# Patient Record
Sex: Male | Born: 1955 | Race: White | Hispanic: No | Smoking: Current every day smoker
Health system: Southern US, Community
[De-identification: ages and names within clinical notes are randomized; demographics above are authoritative.]

---

## 2002-10-24 ENCOUNTER — Encounter: Payer: Self-pay | Admitting: Emergency Medicine

## 2002-10-24 ENCOUNTER — Emergency Department (HOSPITAL_COMMUNITY): Admission: EM | Admit: 2002-10-24 | Discharge: 2002-10-24 | Payer: Self-pay | Admitting: Emergency Medicine

## 2007-02-13 ENCOUNTER — Ambulatory Visit (HOSPITAL_COMMUNITY): Admission: RE | Admit: 2007-02-13 | Discharge: 2007-02-13 | Payer: Self-pay | Admitting: Internal Medicine

## 2009-07-14 IMAGING — CR DG CHEST 2V
2 series · 2 of 2 positions shown · non-contrast
Comparison: There are no prior studies for comparison purposes.

CLINICAL DATA: Smoker, lymphadenopathy.  
CHEST - 2 VIEW:

[view not recorded (1 of 2)]
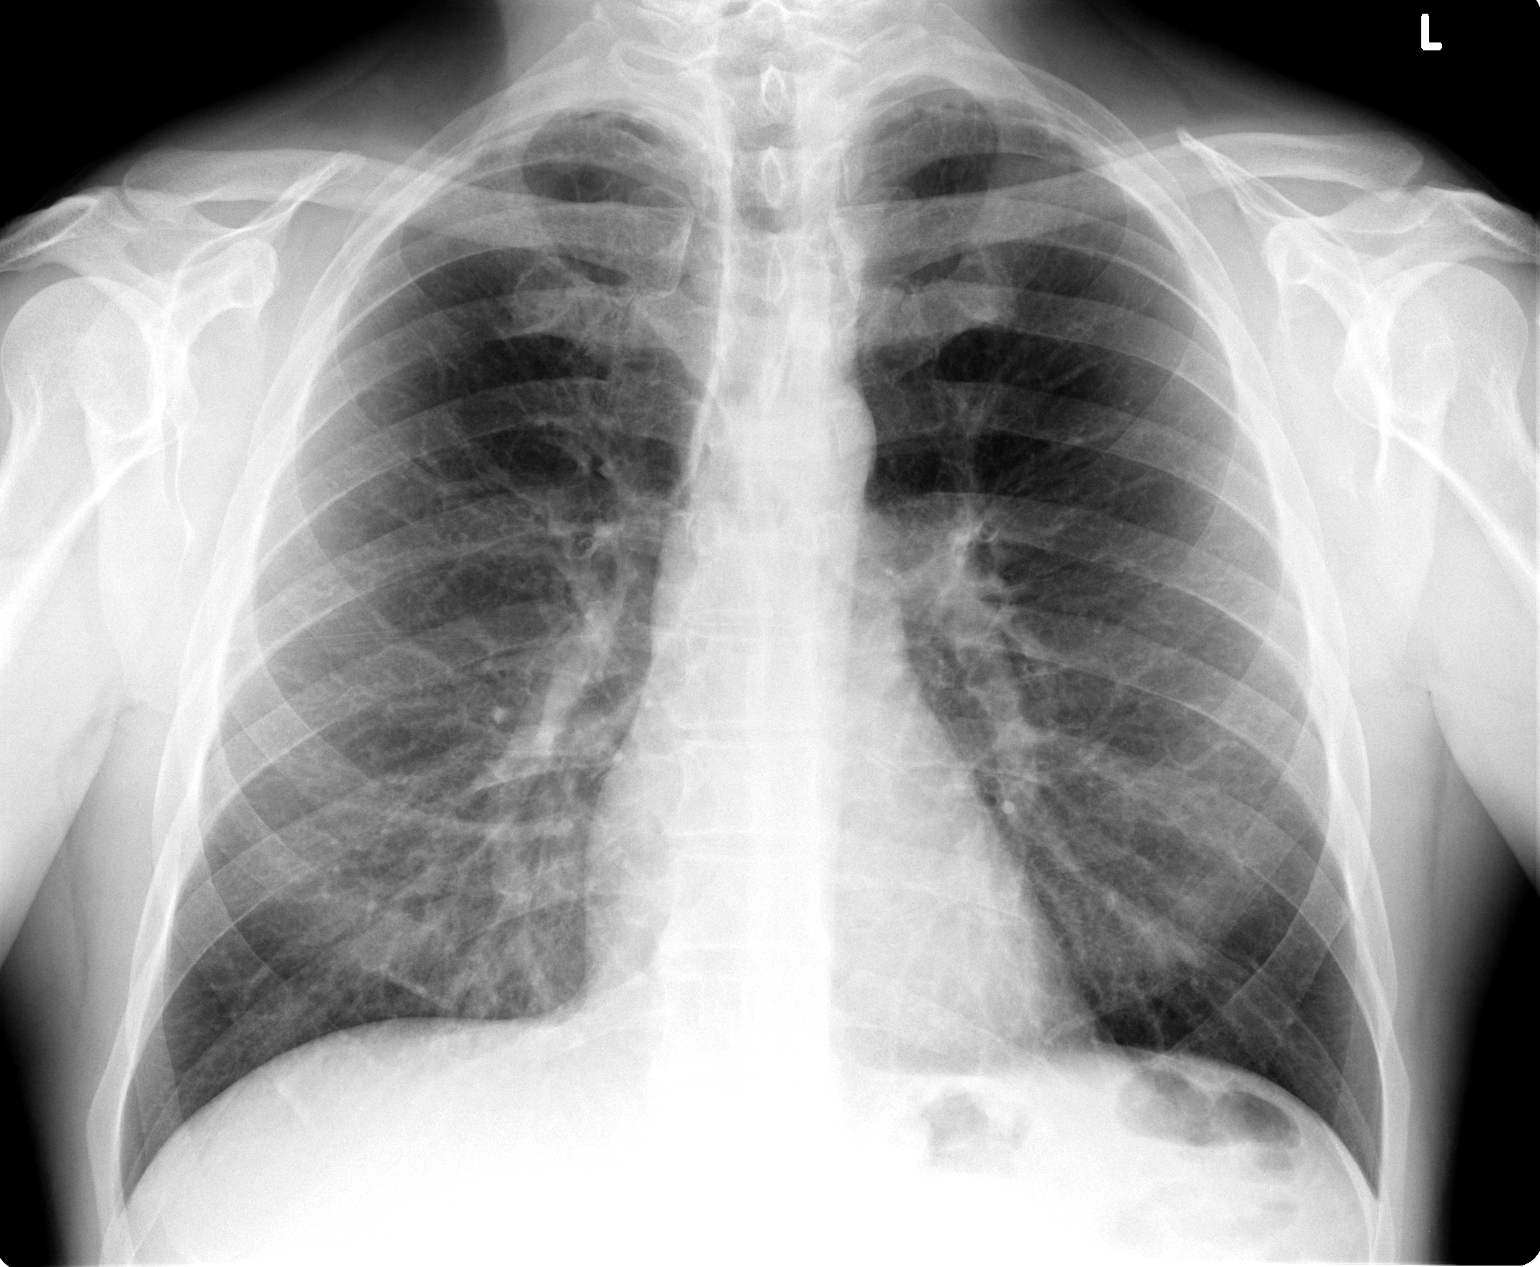

[view not recorded (2 of 2)]
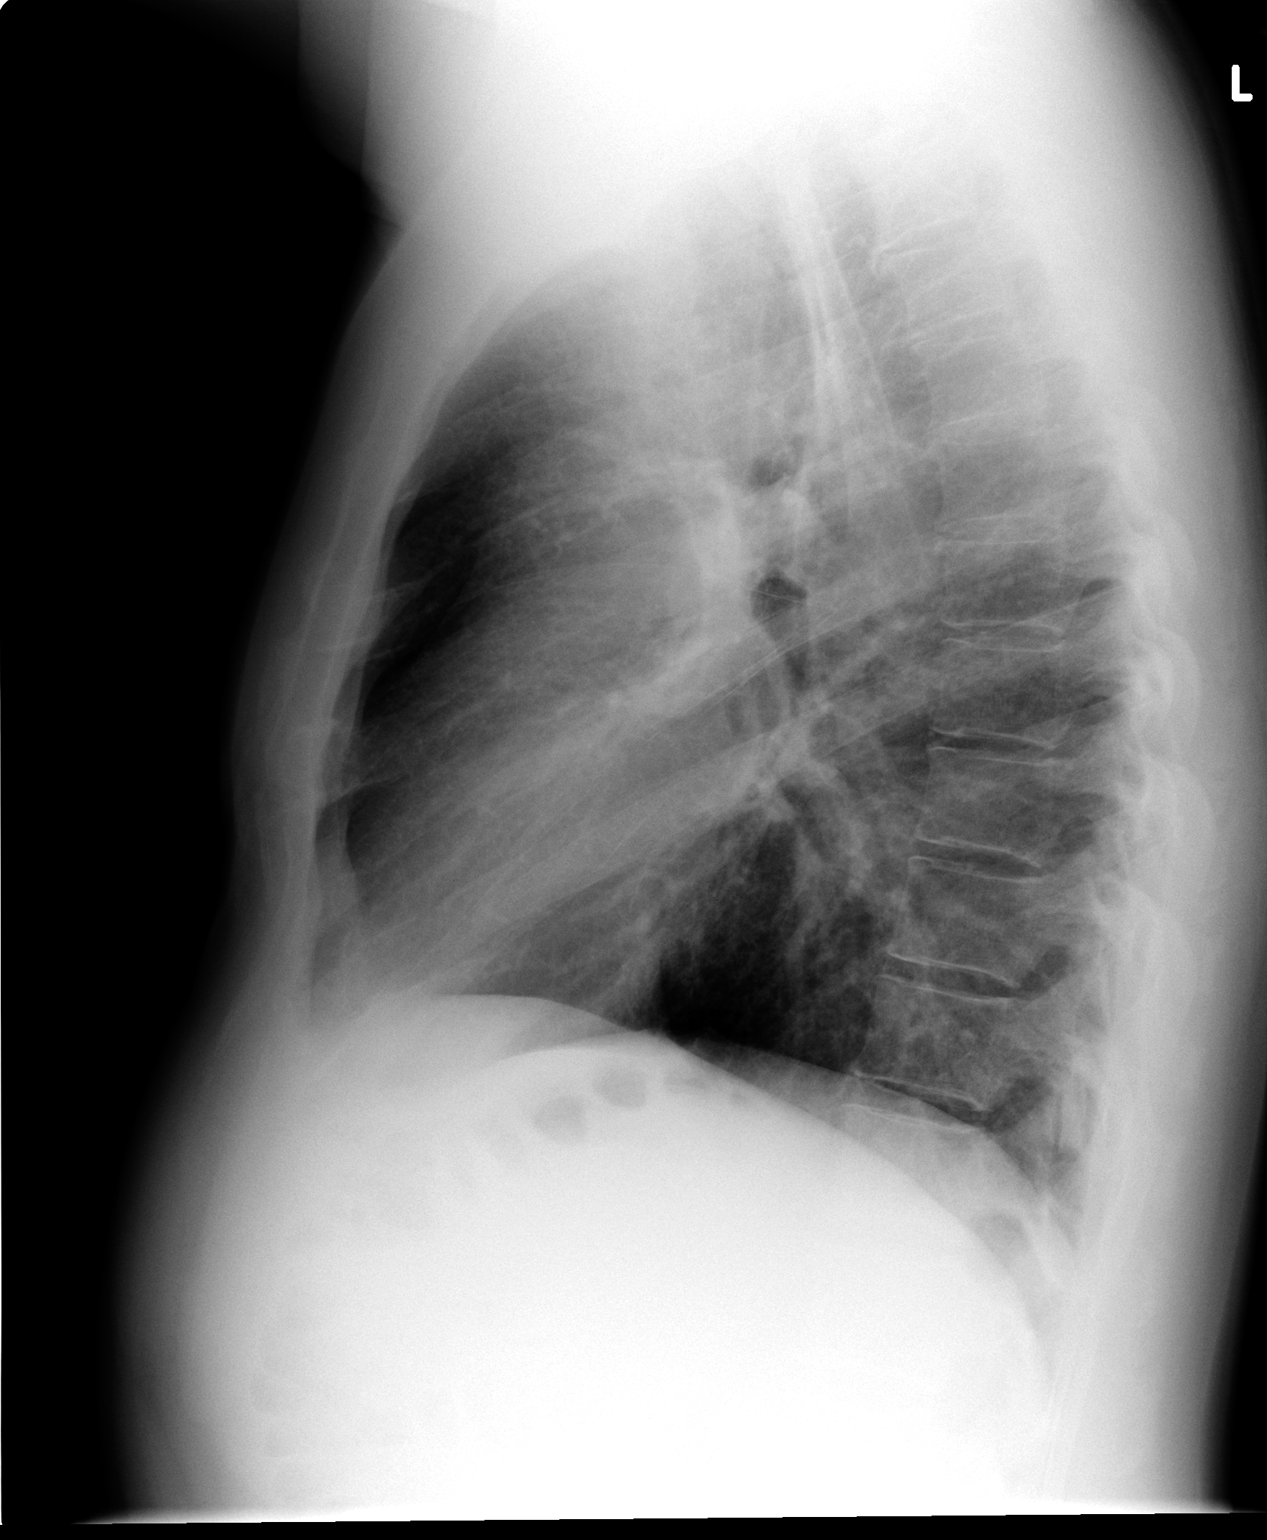

[2 of 2 positions shown; findings below may reference images not displayed]

FINDINGS: The cardiac silhouette, mediastinum, and pulmonary vasculature are within normal limits. Both lungs are clear.   The osseous structures are unremarkable.
IMPRESSION: Normal chest x-ray.

## 2016-01-18 ENCOUNTER — Ambulatory Visit (INDEPENDENT_AMBULATORY_CARE_PROVIDER_SITE_OTHER): Payer: Self-pay | Admitting: Internal Medicine

## 2016-01-18 ENCOUNTER — Encounter: Payer: Self-pay | Admitting: Internal Medicine

## 2016-01-18 VITALS — BP 126/76 | HR 73 | Temp 97.3°F | Resp 16 | Ht 72.0 in | Wt 198.2 lb

## 2016-01-18 DIAGNOSIS — J0140 Acute pansinusitis, unspecified: Secondary | ICD-10-CM

## 2016-01-18 MED ORDER — CLINDAMYCIN HCL 300 MG PO CAPS: 300.0000 mg | ORAL_CAPSULE | Freq: Three times a day (TID) | ORAL | 0 refills | Status: AC

## 2016-01-18 MED ORDER — PROMETHAZINE-DM 6.25-15 MG/5ML PO SYRP: 5.0000 mL | ORAL_SOLUTION | Freq: Four times a day (QID) | ORAL | 0 refills | Status: AC | PRN

## 2016-01-18 MED ORDER — PREDNISONE 20 MG PO TABS: ORAL_TABLET | ORAL | 0 refills | Status: AC

## 2016-01-18 MED ORDER — FLUTICASONE PROPIONATE 50 MCG/ACT NA SUSP: 2.0000 | Freq: Every day | NASAL | 0 refills | Status: AC

## 2016-01-18 NOTE — Progress Notes (Signed)
HPI  Patient presents to the office for evaluation of sinus congestion and sinus pressure.  Patient reports that approximately 3 weeks ago he had the roots of a broken tooth was removed and patient had an abscess drained.  He reports that he is due to have another procedure done.  He reports that he has been having some tooth sensitivity on the other upper teeth.  He does have some throbbing in the maxillary sinus area.  He has been taking aleve.  He reports that he is supposed to have the bone graft done tomorrow.  He reports that he cannot remember the antibiotic, but he said we can call Dr. Mayford Knifeurner.  He reports that he was taking 3 pills a day. He reports that he had the dentist look at it last week.  He is not having drainage and is not having fevers, chills, nausea, or vomiting.  He reports that he is not having any postnasal drainage, no ear drainage, no coughing.    ROS  PE:  General:  Alert and non-toxic, WDWN, NAD HEENT: NCAT, PERLA, EOM normal, no occular discharge or erythema.  Nasal mucosal edema with sinus tenderness to palpation.  Oropharynx clear with minimal oropharyngeal edema and erythema.  Mucous membranes moist and pink. Neck:  Cervical adenopathy Chest:  RRR no MRGs.  Lungs clear to auscultation A&P with no wheezes rhonchi or rales.   Abdomen: +BS x 4 quadrants, soft, non-tender, no guarding, rigidity, or rebound. Skin: warm and dry no rash Neuro: A&Ox4, CN II-XII grossly intact  Assessment and Plan:   1. Acute pansinusitis, recurrence not specified -sudafed pe -benadryl at bedtime - fluticasone (FLONASE) 50 MCG/ACT nasal spray; Place 2 sprays into both nostrils daily.  Dispense: 16 g; Refill: 0 - predniSONE (DELTASONE) 20 MG tablet; 3 tabs po daily x 3 days, then 2 tabs x 3 days, then 1.5 tabs x 3 days, then 1 tab x 3 days, then 0.5 tabs x 3 days  Dispense: 27 tablet; Refill: 0 - clindamycin (CLEOCIN) 300 MG capsule; Take 1 capsule (300 mg total) by mouth 3 (three) times  daily.  Dispense: 21 capsule; Refill: 0 - promethazine-dextromethorphan (PROMETHAZINE-DM) 6.25-15 MG/5ML syrup; Take 5 mLs by mouth 4 (four) times daily as needed for cough.  Dispense: 118 mL; Refill: 0

## 2016-01-18 NOTE — Patient Instructions (Signed)
Please take the sudafed PE up to 3-4 times per day as needed.

## 2017-05-11 ENCOUNTER — Ambulatory Visit (INDEPENDENT_AMBULATORY_CARE_PROVIDER_SITE_OTHER): Payer: Self-pay | Admitting: Adult Health

## 2017-05-11 ENCOUNTER — Encounter: Payer: Self-pay | Admitting: Adult Health

## 2017-05-11 VITALS — BP 140/96 | HR 71 | Temp 97.5°F | Ht 71.0 in | Wt 192.0 lb

## 2017-05-11 DIAGNOSIS — J329 Chronic sinusitis, unspecified: Secondary | ICD-10-CM

## 2017-05-11 DIAGNOSIS — J0101 Acute recurrent maxillary sinusitis: Secondary | ICD-10-CM

## 2017-05-11 MED ORDER — AZITHROMYCIN 250 MG PO TABS: ORAL_TABLET | ORAL | 1 refills | Status: AC

## 2017-05-11 NOTE — Patient Instructions (Signed)
Mucinex/guainefesin   Saline nasal flushes  Consider adding allegra  FLONASE -     Sinusitis, Adult Sinusitis is soreness and inflammation of your sinuses. Sinuses are hollow spaces in the bones around your face. Your sinuses are located:  Around your eyes.  In the middle of your forehead.  Behind your nose.  In your cheekbones.  Your sinuses and nasal passages are lined with a stringy fluid (mucus). Mucus normally drains out of your sinuses. When your nasal tissues become inflamed or swollen, the mucus can become trapped or blocked so air cannot flow through your sinuses. This allows bacteria, viruses, and funguses to grow, which leads to infection. Sinusitis can develop quickly and last for 7?10 days (acute) or for more than 12 weeks (chronic). Sinusitis often develops after a cold. What are the causes? This condition is caused by anything that creates swelling in the sinuses or stops mucus from draining, including:  Allergies.  Asthma.  Bacterial or viral infection.  Abnormally shaped bones between the nasal passages.  Nasal growths that contain mucus (nasal polyps).  Narrow sinus openings.  Pollutants, such as chemicals or irritants in the air.  A foreign object stuck in the nose.  A fungal infection. This is rare.  What increases the risk? The following factors may make you more likely to develop this condition:  Having allergies or asthma.  Having had a recent cold or respiratory tract infection.  Having structural deformities or blockages in your nose or sinuses.  Having a weak immune system.  Doing a lot of swimming or diving.  Overusing nasal sprays.  Smoking.  What are the signs or symptoms? The main symptoms of this condition are pain and a feeling of pressure around the affected sinuses. Other symptoms include:  Upper toothache.  Earache.  Headache.  Bad breath.  Decreased sense of smell and taste.  A cough that may get worse at  night.  Fatigue.  Fever.  Thick drainage from your nose. The drainage is often green and it may contain pus (purulent).  Stuffy nose or congestion.  Postnasal drip. This is when extra mucus collects in the throat or back of the nose.  Swelling and warmth over the affected sinuses.  Sore throat.  Sensitivity to light.  How is this diagnosed? This condition is diagnosed based on symptoms, a medical history, and a physical exam. To find out if your condition is acute or chronic, your health care provider may:  Look in your nose for signs of nasal polyps.  Tap over the affected sinus to check for signs of infection.  View the inside of your sinuses using an imaging device that has a light attached (endoscope).  If your health care provider suspects that you have chronic sinusitis, you may also:  Be tested for allergies.  Have a sample of mucus taken from your nose (nasal culture) and checked for bacteria.  Have a mucus sample examined to see if your sinusitis is related to an allergy.  If your sinusitis does not respond to treatment and it lasts longer than 8 weeks, you may have an MRI or CT scan to check your sinuses. These scans also help to determine how severe your infection is. In rare cases, a bone biopsy may be done to rule out more serious types of fungal sinus disease. How is this treated? Treatment for sinusitis depends on the cause and whether your condition is chronic or acute. If a virus is causing your sinusitis, your symptoms will  go away on their own within 10 days. You may be given medicines to relieve your symptoms, including:  Topical nasal decongestants. They shrink swollen nasal passages and let mucus drain from your sinuses.  Antihistamines. These drugs block inflammation that is triggered by allergies. This can help to ease swelling in your nose and sinuses.  Topical nasal corticosteroids. These are nasal sprays that ease inflammation and swelling in  your nose and sinuses.  Nasal saline washes. These rinses can help to get rid of thick mucus in your nose.  If your condition is caused by bacteria, you will be given an antibiotic medicine. If your condition is caused by a fungus, you will be given an antifungal medicine. Surgery may be needed to correct underlying conditions, such as narrow nasal passages. Surgery may also be needed to remove polyps. Follow these instructions at home: Medicines  Take, use, or apply over-the-counter and prescription medicines only as told by your health care provider. These may include nasal sprays.  If you were prescribed an antibiotic medicine, take it as told by your health care provider. Do not stop taking the antibiotic even if you start to feel better. Hydrate and Humidify  Drink enough water to keep your urine clear or pale yellow. Staying hydrated will help to thin your mucus.  Use a cool mist humidifier to keep the humidity level in your home above 50%.  Inhale steam for 10-15 minutes, 3-4 times a day or as told by your health care provider. You can do this in the bathroom while a hot shower is running.  Limit your exposure to cool or dry air. Rest  Rest as much as possible.  Sleep with your head raised (elevated).  Make sure to get enough sleep each night. General instructions  Apply a warm, moist washcloth to your face 3-4 times a day or as told by your health care provider. This will help with discomfort.  Wash your hands often with soap and water to reduce your exposure to viruses and other germs. If soap and water are not available, use hand sanitizer.  Do not smoke. Avoid being around people who are smoking (secondhand smoke).  Keep all follow-up visits as told by your health care provider. This is important. Contact a health care provider if:  You have a fever.  Your symptoms get worse.  Your symptoms do not improve within 10 days. Get help right away if:  You have a  severe headache.  You have persistent vomiting.  You have pain or swelling around your face or eyes.  You have vision problems.  You develop confusion.  Your neck is stiff.  You have trouble breathing. This information is not intended to replace advice given to you by your health care provider. Make sure you discuss any questions you have with your health care provider. Document Released: 03/07/2005 Document Revised: 11/01/2015 Document Reviewed: 12/31/2014 Elsevier Interactive Patient Education  [employer].

## 2017-05-11 NOTE — Progress Notes (Signed)
Assessment and Plan:  Calvin Molina was seen today for sinus problem.  Diagnoses and all orders for this visit:  Acute recurrent maxillary sinusitis Suggested symptomatic OTC remedies. Nasal saline spray for congestion. Nasal steroids, allergy pill, cannot tolerate oral steroids Follow up as needed. Present to ER for worsening symptoms, vision changes, HA -     azithromycin (ZITHROMAX) 250 MG tablet; Take 2 tablets (500 mg) on  Day 1,  followed by 1 tablet (250 mg) once daily on Days 2 through 5.  Further disposition pending results of labs. Discussed med's effects and SE's.   Over 15 minutes of exam, counseling, chart review, and critical decision making was performed.   No future appointments.  ------------------------------------------------------------------------------------------------------------------   HPI BP (!) 140/96   Pulse 71   Temp (!) 97.5 F (36.4 C)   Ht 5\' 11"  (1.803 m)   Wt 192 lb (87.1 kg)   SpO2 97%   BMI 26.78 kg/m   62 y.o.male presents for 5 days of sinus pain - R maxillary pressure/pain radiating to jaw/teeth. Denies fever/chills, vision changes, dizziness, cough, sore throat, nausea/vomiting.   He has tried sudafed and benadryl with some benefit, has been icing at night.   He reports recurrent annual sinus infections. Doesn't tolerate prednisone. Hasn't tried flonase. Reports typically recovers well with zpak. He is a current everyday smoker.   No past medical history on file.   Allergies  Allergen Reactions  . Bee Venom   . Penicillins   . Prednisone Other (See Comments)    Chest pain    Current Outpatient Medications on File Prior to Visit  Medication Sig  . fluticasone (FLONASE) 50 MCG/ACT nasal spray Place 2 sprays into both nostrils daily. (Patient not taking: Reported on 05/11/2017)  . predniSONE (DELTASONE) 20 MG tablet 3 tabs po daily x 3 days, then 2 tabs x 3 days, then 1.5 tabs x 3 days, then 1 tab x 3 days, then 0.5 tabs x 3 days  .  promethazine-dextromethorphan (PROMETHAZINE-DM) 6.25-15 MG/5ML syrup Take 5 mLs by mouth 4 (four) times daily as needed for cough.   No current facility-administered medications on file prior to visit.     ROS: all negative except above.   Physical Exam:  BP (!) 140/96   Pulse 71   Temp (!) 97.5 F (36.4 C)   Ht 5\' 11"  (1.803 m)   Wt 192 lb (87.1 kg)   SpO2 97%   BMI 26.78 kg/m   General Appearance: Well nourished, in no apparent distress. Eyes: PERRLA, EOMs, conjunctiva no swelling or erythema Sinuses: R sided maxillary tenderness and visible swelling ENT/Mouth: Ext aud canals clear, TMs without erythema, bulging. No erythema, swelling, or exudate on post pharynx.  Tonsils not swollen or erythematous. Hearing normal. Some swelling noted to inner cheek on R.  Neck: Supple.  Respiratory: Respiratory effort normal, BS equal bilaterally without rales, rhonchi, wheezing or stridor.  Cardio: RRR with no MRGs. Brisk peripheral pulses without edema.  Abdomen: Soft, + BS.  Non tender. Lymphatics: Non tender without lymphadenopathy.  Musculoskeletal: normal gait.  Skin: Warm, dry without rashes, lesions, ecchymosis.  Neuro: Cranial nerves intact. Normal muscle tone, no cerebellar symptoms. Sensation intact.  Psych: Awake and oriented X 3, normal affect, Insight and Judgment appropriate.    Dan MakerAshley C Saory Carriero, NP 3:04 PM [facility]

## 2022-04-19 DIAGNOSIS — K080 Exfoliation of teeth due to systemic causes: Secondary | ICD-10-CM | POA: Diagnosis not present

## 2022-04-28 DIAGNOSIS — K080 Exfoliation of teeth due to systemic causes: Secondary | ICD-10-CM | POA: Diagnosis not present

## 2022-05-17 DIAGNOSIS — K080 Exfoliation of teeth due to systemic causes: Secondary | ICD-10-CM | POA: Diagnosis not present

## 2022-10-11 DIAGNOSIS — K080 Exfoliation of teeth due to systemic causes: Secondary | ICD-10-CM | POA: Diagnosis not present

## 2022-10-18 DIAGNOSIS — K080 Exfoliation of teeth due to systemic causes: Secondary | ICD-10-CM | POA: Diagnosis not present

## 2023-05-02 DIAGNOSIS — K080 Exfoliation of teeth due to systemic causes: Secondary | ICD-10-CM | POA: Diagnosis not present

## 2023-10-12 ENCOUNTER — Ambulatory Visit: Payer: Self-pay | Admitting: Family Medicine
